# Patient Record
Sex: Male | Born: 1985 | Race: White | Hispanic: No | Marital: Married
Health system: Southern US, Community
[De-identification: ages and names within clinical notes are randomized; demographics above are authoritative.]

---

## 2019-09-18 ENCOUNTER — Emergency Department (HOSPITAL_COMMUNITY): Payer: Self-pay

## 2019-09-18 ENCOUNTER — Ambulatory Visit (HOSPITAL_COMMUNITY)
Admission: EM | Admit: 2019-09-18 | Discharge: 2019-09-19 | Disposition: A | Discharge status: Home / Self Care | Payer: Self-pay | Attending: Emergency Medicine | Admitting: Emergency Medicine

## 2019-09-18 ENCOUNTER — Encounter (HOSPITAL_COMMUNITY)
Admission: EM | Disposition: A | Discharge status: Home / Self Care | Payer: Self-pay | Source: Home / Self Care | Attending: Emergency Medicine

## 2019-09-18 ENCOUNTER — Other Ambulatory Visit: Payer: Self-pay

## 2019-09-18 ENCOUNTER — Encounter (HOSPITAL_COMMUNITY): Payer: Self-pay | Admitting: Emergency Medicine

## 2019-09-18 ENCOUNTER — Emergency Department (HOSPITAL_COMMUNITY): Payer: Self-pay | Admitting: Anesthesiology

## 2019-09-18 DIAGNOSIS — W228XXA Striking against or struck by other objects, initial encounter: Secondary | ICD-10-CM | POA: Insufficient documentation

## 2019-09-18 DIAGNOSIS — Z79899 Other long term (current) drug therapy: Secondary | ICD-10-CM | POA: Insufficient documentation

## 2019-09-18 DIAGNOSIS — N433 Hydrocele, unspecified: Secondary | ICD-10-CM | POA: Insufficient documentation

## 2019-09-18 DIAGNOSIS — S3994XA Unspecified injury of external genitals, initial encounter: Secondary | ICD-10-CM | POA: Insufficient documentation

## 2019-09-18 DIAGNOSIS — Z20822 Contact with and (suspected) exposure to covid-19: Secondary | ICD-10-CM | POA: Insufficient documentation

## 2019-09-18 DIAGNOSIS — T1490XA Injury, unspecified, initial encounter: Secondary | ICD-10-CM

## 2019-09-18 HISTORY — PX: SCROTAL EXPLORATION: SHX2386

## 2019-09-18 LAB — CBC
HCT: 42.7 % (ref 39.0–52.0)
Hemoglobin: 13.6 g/dL (ref 13.0–17.0)
MCH: 28.2 pg (ref 26.0–34.0)
MCHC: 31.9 g/dL (ref 30.0–36.0)
MCV: 88.4 fL (ref 80.0–100.0)
Platelets: 188 10*3/uL (ref 150–400)
RBC: 4.83 MIL/uL (ref 4.22–5.81)
RDW: 12.9 % (ref 11.5–15.5)
WBC: 7.3 10*3/uL (ref 4.0–10.5)
nRBC: 0 % (ref 0.0–0.2)

## 2019-09-18 LAB — BASIC METABOLIC PANEL
Anion gap: 12 (ref 5–15)
BUN: 21 mg/dL — ABNORMAL HIGH (ref 6–20)
CO2: 23 mmol/L (ref 22–32)
Calcium: 9.8 mg/dL (ref 8.9–10.3)
Chloride: 105 mmol/L (ref 98–111)
Creatinine, Ser: 1.02 mg/dL (ref 0.61–1.24)
GFR calc Af Amer: >60 mL/min (ref >60)
GFR calc non Af Amer: >60 mL/min (ref >60)
Glucose, Bld: 77 mg/dL (ref 70–99)
Potassium: 4.2 mmol/L (ref 3.5–5.1)
Sodium: 140 mmol/L (ref 135–145)

## 2019-09-18 LAB — URINALYSIS, ROUTINE W REFLEX MICROSCOPIC
Bilirubin Urine: NEGATIVE
Glucose, UA: NEGATIVE mg/dL
Hgb urine dipstick: NEGATIVE
Ketones, ur: NEGATIVE mg/dL
Leukocytes,Ua: NEGATIVE
Nitrite: NEGATIVE
Protein, ur: NEGATIVE mg/dL
Specific Gravity, Urine: 1.038 — ABNORMAL HIGH (ref 1.005–1.030)
pH: 9 — ABNORMAL HIGH (ref 5.0–8.0)

## 2019-09-18 LAB — SARS CORONAVIRUS 2 BY RT PCR (HOSPITAL ORDER, PERFORMED IN ~~LOC~~ HOSPITAL LAB): SARS Coronavirus 2: NEGATIVE

## 2019-09-18 SURGERY — EXPLORATION, SCROTUM
Anesthesia: General | Laterality: Right

## 2019-09-18 MED ORDER — FENTANYL CITRATE (PF) 250 MCG/5ML IJ SOLN
INTRAMUSCULAR | Status: AC
  Filled 2019-09-18: qty 5

## 2019-09-18 MED ORDER — MIDAZOLAM HCL 5 MG/5ML IJ SOLN
INTRAMUSCULAR | Status: DC | PRN
  Administered 2019-09-18: 2 mg via INTRAVENOUS

## 2019-09-18 MED ORDER — DEXAMETHASONE SODIUM PHOSPHATE 10 MG/ML IJ SOLN
INTRAMUSCULAR | Status: DC | PRN
  Administered 2019-09-18: 10 mg via INTRAVENOUS

## 2019-09-18 MED ORDER — PROPOFOL 10 MG/ML IV BOLUS
INTRAVENOUS | Status: DC | PRN
  Administered 2019-09-18: 170 mg via INTRAVENOUS

## 2019-09-18 MED ORDER — SUCCINYLCHOLINE CHLORIDE 200 MG/10ML IV SOSY
PREFILLED_SYRINGE | INTRAVENOUS | Status: DC | PRN
  Administered 2019-09-18: 130 mg via INTRAVENOUS

## 2019-09-18 MED ORDER — IOHEXOL 300 MG/ML  SOLN
100.0000 mL | Freq: Once | INTRAMUSCULAR | Status: AC | PRN
  Administered 2019-09-18: 100 mL via INTRAVENOUS

## 2019-09-18 MED ORDER — BUPIVACAINE HCL (PF) 0.5 % IJ SOLN
INTRAMUSCULAR | Status: AC
  Filled 2019-09-18: qty 30

## 2019-09-18 MED ORDER — DIPHENHYDRAMINE HCL 50 MG/ML IJ SOLN
INTRAMUSCULAR | Status: DC | PRN
  Administered 2019-09-18: 12.5 mg via INTRAVENOUS

## 2019-09-18 MED ORDER — OXYCODONE HCL 5 MG/5ML PO SOLN: 5.0000 mg | Freq: Once | ORAL | Status: DC | PRN

## 2019-09-18 MED ORDER — ONDANSETRON HCL 4 MG/2ML IJ SOLN
INTRAMUSCULAR | Status: DC | PRN
  Administered 2019-09-18: 4 mg via INTRAVENOUS

## 2019-09-18 MED ORDER — LIDOCAINE 2% (20 MG/ML) 5 ML SYRINGE
INTRAMUSCULAR | Status: DC | PRN
  Administered 2019-09-18: 100 mg via INTRAVENOUS

## 2019-09-18 MED ORDER — HYDROMORPHONE HCL 1 MG/ML IJ SOLN
1.0000 mg | INTRAMUSCULAR | Status: AC | PRN
  Administered 2019-09-18 (×2): 1 mg via INTRAVENOUS
  Filled 2019-09-18 (×2): qty 1

## 2019-09-18 MED ORDER — SODIUM CHLORIDE 0.9 % IV SOLN
1000.0000 mL | INTRAVENOUS | Status: DC
  Administered 2019-09-18: 1000 mL via INTRAVENOUS

## 2019-09-18 MED ORDER — CEFAZOLIN (ANCEF) 1 G IV SOLR: 2.0000 g | INTRAVENOUS | Status: DC

## 2019-09-18 MED ORDER — BACITRACIN-NEOMYCIN-POLYMYXIN OINTMENT TUBE
TOPICAL_OINTMENT | CUTANEOUS | Status: AC
  Filled 2019-09-18: qty 14.17

## 2019-09-18 MED ORDER — LACTATED RINGERS IV SOLN: INTRAVENOUS | Status: DC | PRN

## 2019-09-18 MED ORDER — OXYCODONE HCL 5 MG PO TABS: 5.0000 mg | ORAL_TABLET | Freq: Once | ORAL | Status: DC | PRN

## 2019-09-18 MED ORDER — MIDAZOLAM HCL 2 MG/2ML IJ SOLN
INTRAMUSCULAR | Status: AC
  Filled 2019-09-18: qty 2

## 2019-09-18 MED ORDER — FENTANYL CITRATE (PF) 100 MCG/2ML IJ SOLN: 25.0000 ug | INTRAMUSCULAR | Status: DC | PRN

## 2019-09-18 MED ORDER — ONDANSETRON HCL 4 MG/2ML IJ SOLN: 4.0000 mg | Freq: Once | INTRAMUSCULAR | Status: DC | PRN

## 2019-09-18 MED ORDER — PROPOFOL 10 MG/ML IV BOLUS
INTRAVENOUS | Status: AC
  Filled 2019-09-18: qty 20

## 2019-09-18 MED ORDER — OXYCODONE-ACETAMINOPHEN 5-325 MG PO TABS: 1.0000 | ORAL_TABLET | Freq: Four times a day (QID) | ORAL | 0 refills | Status: AC | PRN

## 2019-09-18 MED ORDER — FENTANYL CITRATE (PF) 250 MCG/5ML IJ SOLN
INTRAMUSCULAR | Status: DC | PRN
  Administered 2019-09-18: 50 ug via INTRAVENOUS
  Administered 2019-09-18: 100 ug via INTRAVENOUS

## 2019-09-18 MED ORDER — SODIUM CHLORIDE 0.9 % IV BOLUS (SEPSIS)
1000.0000 mL | Freq: Once | INTRAVENOUS | Status: AC
  Administered 2019-09-18: 1000 mL via INTRAVENOUS

## 2019-09-18 MED ORDER — CEFAZOLIN SODIUM-DEXTROSE 2-4 GM/100ML-% IV SOLN
2.0000 g | INTRAVENOUS | Status: AC
  Administered 2019-09-18: 2 g via INTRAVENOUS
  Filled 2019-09-18 (×2): qty 100

## 2019-09-18 MED ORDER — BUPIVACAINE HCL (PF) 0.25 % IJ SOLN
INTRAMUSCULAR | Status: DC | PRN
  Administered 2019-09-18: 9 mL

## 2019-09-18 SURGICAL SUPPLY — 34 items
BAG DECANTER FOR FLEXI CONT (MISCELLANEOUS) ×3 IMPLANT
BLADE SURG 15 STRL LF DISP TIS (BLADE) ×1 IMPLANT
BLADE SURG 15 STRL SS (BLADE) ×3
COVER SURGICAL LIGHT HANDLE (MISCELLANEOUS) ×6 IMPLANT
COVER WAND RF STERILE (DRAPES) ×3 IMPLANT
DRAPE HALF SHEET 40X57 (DRAPES) IMPLANT
DRAPE LAPAROTOMY T 102X78X121 (DRAPES) IMPLANT
ELECT REM PT RETURN 9FT ADLT (ELECTROSURGICAL) ×3
ELECTRODE REM PT RTRN 9FT ADLT (ELECTROSURGICAL) ×1 IMPLANT
GAUZE 4X4 16PLY RFD (DISPOSABLE) ×3 IMPLANT
GLOVE BIOGEL M STRL SZ7.5 (GLOVE) ×3 IMPLANT
GOWN STRL REUS W/ TWL LRG LVL3 (GOWN DISPOSABLE) ×2 IMPLANT
GOWN STRL REUS W/TWL LRG LVL3 (GOWN DISPOSABLE) ×6
HANDPIECE INTERPULSE COAX TIP (DISPOSABLE)
KIT BASIN OR (CUSTOM PROCEDURE TRAY) ×3 IMPLANT
KIT TURNOVER KIT B (KITS) ×3 IMPLANT
LEGGING LITHOTOMY PAIR STRL (DRAPES) IMPLANT
NEEDLE HYPO 25X1 1.5 SAFETY (NEEDLE) IMPLANT
NS IRRIG 1000ML POUR BTL (IV SOLUTION) ×3 IMPLANT
PACK SURGICAL SETUP 50X90 (CUSTOM PROCEDURE TRAY) ×3 IMPLANT
PAD ARMBOARD 7.5X6 YLW CONV (MISCELLANEOUS) ×6 IMPLANT
PENCIL BUTTON HOLSTER BLD 10FT (ELECTRODE) ×3 IMPLANT
SET HNDPC FAN SPRY TIP SCT (DISPOSABLE) IMPLANT
SPONGE LAP 4X18 RFD (DISPOSABLE) IMPLANT
SUPPORT SCROTAL MEDIUM (SOFTGOODS) ×3 IMPLANT
SUT CHROMIC 3 0 PS 2 (SUTURE) IMPLANT
SUT CHROMIC 3 0 SH 27 (SUTURE) IMPLANT
SUT CHROMIC 4 0 RB 1X27 (SUTURE) IMPLANT
SYR CONTROL 10ML LL (SYRINGE) ×3 IMPLANT
TOWEL GREEN STERILE FF (TOWEL DISPOSABLE) ×3 IMPLANT
TUBE CONNECTING 12'X1/4 (SUCTIONS) ×1
TUBE CONNECTING 12X1/4 (SUCTIONS) ×2 IMPLANT
WATER STERILE IRR 1000ML POUR (IV SOLUTION) ×3 IMPLANT
YANKAUER SUCT BULB TIP NO VENT (SUCTIONS) ×3 IMPLANT

## 2019-09-18 NOTE — ED Provider Notes (Signed)
[facility] EMERGENCY DEPARTMENT Provider Note   CSN: [id number] Arrival date & time: 09/18/19  1457     History Crush injury  Charles Erickson is a 33 y.o. male.  HPI   Patient presents ED for evaluation after being crushed by a tree. Patient was working at heights on a tree. The pulley system malfunctioned and the patient was pinned to the tree. Patient was hit in the thigh and pelvis region by an approximately 1000 pound limit. Patient denies any loss of consciousness. He is not having any chest pain or shortness of breath. He is having severe pain in his groin as well as his pelvis and thighs.  History reviewed. No pertinent past medical history.  There are no problems to display for this patient.   History reviewed. No pertinent surgical history.     History reviewed. No pertinent family history.  Social History   Tobacco Use  . Smoking status: Not on file  Substance Use Topics  . Alcohol use: Not on file  . Drug use: Not on file    Home Medications Prior to Admission medications   Medication Sig Start Date End Date Taking? Authorizing Provider  albuterol (VENTOLIN HFA) 108 (90 Base) MCG/ACT inhaler Inhale 2 puffs into the lungs every 6 (six) hours as needed for wheezing or shortness of breath.   Yes [provider]  METHADONE HCL PO Take 1 tablet by mouth daily. 200mg    Yes [provider]    Allergies    Patient has no known allergies.  Review of Systems   Review of Systems  All other systems reviewed and are negative.   Physical Exam Updated Vital Signs BP (!) 138/101   Pulse 78   Temp 99.2 F (37.3 C) (Oral)   Resp 17   Ht 1.829 m (6')   Wt 72.6 kg   SpO2 99%   BMI 21.70 kg/m   Physical Exam Vitals and nursing note reviewed.  Constitutional:      General: He is not in acute distress.    Appearance: Normal appearance. He is well-developed. He is not diaphoretic.  HENT:     Head: Normocephalic and  atraumatic. No raccoon eyes or Battle's sign.     Right Ear: External ear normal.     Left Ear: External ear normal.  Eyes:     General: Lids are normal.        Right eye: No discharge.     Conjunctiva/sclera:     Right eye: No hemorrhage.    Left eye: No hemorrhage. Neck:     Trachea: No tracheal deviation.  Cardiovascular:     Rate and Rhythm: Normal rate and regular rhythm.     Heart sounds: Normal heart sounds.  Pulmonary:     Effort: Pulmonary effort is normal. No respiratory distress.     Breath sounds: Normal breath sounds. No stridor.  Chest:     Chest wall: No deformity, tenderness or crepitus.  Abdominal:     General: Bowel sounds are normal. There is no distension.     Palpations: Abdomen is soft. There is no mass.     Tenderness: There is no abdominal tenderness.     Hernia: There is no hernia in the left inguinal area or right inguinal area.     Comments: No bruising abdomen  Genitourinary:    Penis: Tenderness present.      Testes:        Right: Tenderness and swelling present.  Left: Tenderness or swelling not present.  Musculoskeletal:     Cervical back: Normal. No swelling, edema, deformity or tenderness. No spinous process tenderness.     Right upper leg: Swelling, edema and tenderness present.     Left upper leg: Swelling, edema and tenderness present.     Right knee: Bony tenderness present. No deformity.     Left knee: Bony tenderness present. No deformity.     Right lower leg: Normal.     Left lower leg: Normal.     Right ankle: Normal.     Left ankle: Normal.     Comments: ttp pelvis, bruising noted  Skin:    Comments: Abrasions thighs  Neurological:     Mental Status: He is alert.     GCS: GCS eye subscore is 4. GCS verbal subscore is 5. GCS motor subscore is 6.     Sensory: No sensory deficit.     Motor: No abnormal muscle tone.     Comments: Able to move all extremities, sensation intact throughout  Psychiatric:        Speech: Speech  normal.        Behavior: Behavior normal.     ED Results / Procedures / Treatments   Labs (all labs ordered are listed, but only abnormal results are displayed) Labs Reviewed  URINALYSIS, ROUTINE W REFLEX MICROSCOPIC - Abnormal; Notable for the following components:      Result Value   APPearance CLOUDY (*)    Specific Gravity, Urine 1.038 (*)    pH 9.0 (*)    All other components within normal limits  BASIC METABOLIC PANEL - Abnormal; Notable for the following components:   BUN 21 (*)    All other components within normal limits  SARS CORONAVIRUS 2 BY RT PCR (HOSPITAL ORDER, PERFORMED IN [facility])  CBC  TYPE AND SCREEN  ABO/RH    EKG None  Radiology DG Chest 1 View  Result Date: 09/18/2019 CLINICAL DATA:  Struck by falling equipment/branch EXAM: CHEST  1 VIEW COMPARISON:  None. FINDINGS: No consolidation, features of edema, pneumothorax, or effusion. Pulmonary vascularity is normally distributed. The cardiomediastinal contours are unremarkable. No acute osseous or soft tissue abnormality. Age advanced degenerative changes of the right shoulder without clear traumatic injury. Telemetry leads overlie the chest. IMPRESSION: No acute cardiopulmonary or traumatic findings in the chest. Slightly age advanced degenerative change of the right shoulder. Correlate with history. Electronically Signed   By: Kreg Shropshire M.D.   On: 09/18/2019 17:21   US SCROTUM  Result Date: 09/18/2019 CLINICAL DATA:  Blunt trauma EXAM: ULTRASOUND OF SCROTUM TECHNIQUE: Complete ultrasound examination of the testicles, epididymis, and other scrotal structures was performed. COMPARISON:  None. FINDINGS: Right testicle Measurements: 5.5 x 3.5 x 3.6 cm. Mildly heterogeneous parenchymal echogenicity with some increased vascularity could reflect traumatic orchitis. There is a focal almost bandlike area of hypoechogenicity extending through the lower scrotal pole and traversing the capsule inferiorly  with some spared testicular parenchyma. Overall size of this abnormality is approximately 1.3 x 1.9 x 1.8 cm in size. A second site of hypoechoic heterogeneity is seen along the superior pole as well measuring 0.7 x 0.7 x 0.8 cm in size. Left testicle Measurements: 4.3 x 2.1 x 3.3 cm. Mild parenchymal heterogeneity with increased vascularity suggestive traumatic orchitis. No concerning testicular mass or lesion. Right epididymis: Anechoic 1.1 x 1.2 x 1.2 right epididymal head cyst. Background epididymal parenchyma is more normal in appearance. Left epididymis: 2.9  x 1.7 x 2.7 cm anechoic left epididymal head cyst as well. Visible portions of the left epididymis are more normal appearance. Hydrocele: Small bilateral hydroceles without internal complexity is suggest a large hematocele. Varicocele:  None visualized. IMPRESSION: Bandlike hypoechoic region extending through the lower pole right testis is concerning for testicular fracture without clear disruption of the tunic albuginea to suggest frank testicular rupture. An additional hypoechoic region in the upper pole may reflect a secondary site of contained intraparenchymal hematoma. More mild bilateral heterogeneity of both testes with increased vascularity could suggest some traumatic orchitis as well. Bilateral epididymal head cysts, left greater than right. Small anechoic bilateral hydroceles without discernible hemorrhagic products to suggest capsular rupture. These results were called by telephone at the time of interpretation on 09/18/2019 at 5:19 pm to provider [facility] , who verbally acknowledged these results. Electronically Signed   By: Kreg Shropshire M.D.   On: 09/18/2019 17:17   CT ABDOMEN PELVIS W CONTRAST  Result Date: 09/18/2019 CLINICAL DATA:  Pinned by tree limb and tree, right abdominal and flank pain EXAM: CT ABDOMEN AND PELVIS WITH CONTRAST TECHNIQUE: Multidetector CT imaging of the abdomen and pelvis was performed using the standard protocol  following bolus administration of intravenous contrast. CONTRAST:  OMNIPAQUE IOHEXOL 300 MG/ML  SOLN COMPARISON:  Chest radiograph 09/18/2019 FINDINGS: Lower chest: Lung bases are clear. Normal heart size. No pericardial effusion. Hepatobiliary: No hepatic injury or perihepatic hematoma. No worrisome focal liver lesions. Smooth liver surface contour. Normal hepatic attenuation. Normal gallbladder and biliary tree without visible calcified gallstone. Pancreas: Slight diminutive appearance of the pancreas without contusive change, ductal disruption, focal peripancreatic inflammation or ductal dilatation. Spleen: No splenic injury or perisplenic hematoma. Normal splenic size. No concerning lesions. Adrenals/Urinary Tract: No adrenal hemorrhage or suspicious adrenal lesions. No direct renal injury or perinephric hemorrhage. Kidneys enhance and excrete symmetrically without extravasation of contrast on excretory delayed phase imaging. Few subcentimeter hypertension foci in both kidneys too small to fully characterize on CT imaging but statistically likely benign. No concerning renal mass. No urolithiasis or hydronephrosis. No evidence of traumatic bladder injury. Stomach/Bowel: Distal esophagus, stomach and duodenal sweep are unremarkable. No small bowel wall thickening or dilatation. No evidence of obstruction. A normal appendix is visualized. No colonic dilatation or wall thickening. No evidence of mesenteric hematoma or contusion. Vascular/Lymphatic: No acute vascular luminal abnormality or direct vascular injury of the abdomen or pelvis. No aneurysm or ectasia. No suspicious or enlarged lymph nodes in the included lymphatic chains. Reproductive: The prostate and seminal vesicles are unremarkable. Other: No abdominal wall hematoma or contusion. No retroperitoneal hemorrhage. No abdominopelvic free air or fluid. No bowel containing hernias. Musculoskeletal: No acute injury of the included ribs, thoracolumbar  spine, bony pelvis or proximal femora. Musculature appears normal and symmetric without traumatic abdominal wall dehiscence. IMPRESSION: No evidence of acute traumatic injury within the abdomen or pelvis. Electronically Signed   By: Kreg Shropshire M.D.   On: 09/18/2019 17:32   DG Knee Complete 4 Views Left  Result Date: 09/18/2019 CLINICAL DATA:  Struck by falling equipment/tree branch EXAM: LEFT KNEE - COMPLETE 4+ VIEW COMPARISON:  Concurrent femoral radiographs FINDINGS: No significant soft tissue swelling or joint effusion. No soft tissue gas or foreign body. No acute bony abnormality. Specifically, no fracture, subluxation, or dislocation. No significant arthrosis or underlying arthropathy. Normal bone mineralization. No worrisome osseous lesions. IMPRESSION: No acute osseous or soft tissue abnormality. Electronically Signed   By: Coralie Keens.D.  On: 09/18/2019 17:22   DG Knee Complete 4 Views Right  Result Date: 09/18/2019 CLINICAL DATA:  Struck by falling equipment/tree branch. EXAM: RIGHT KNEE - COMPLETE 4+ VIEW COMPARISON:  Concurrent femoral radiographs FINDINGS: No significant swelling or effusion. No soft tissue gas or foreign body. No acute bony abnormality. Specifically, no fracture, subluxation, or dislocation. Mild enthesopathic changes along the anterosuperior pole patella at the distal quadriceps insertion. Corticated fabella posteriorly. No acute or worrisome osseous lesion. IMPRESSION: 1. No acute osseous or soft tissue abnormality. 2. Mild enthesopathic changes along the anterosuperior pole patellar insertion of the distal quadriceps tendon. Electronically Signed   By: Kreg Shropshire M.D.   On: 09/18/2019 17:25   DG FEMUR MIN 2 VIEWS LEFT  Result Date: 09/18/2019 CLINICAL DATA:  Struck by falling equipment/tree branch. EXAM: LEFT FEMUR 2 VIEWS COMPARISON:  Concurrent knee radiographs. FINDINGS: Included bones of the left hemipelvis are intact and unremarkable. Left femur is intact.  Femoral head is normally located. No significant soft tissue swelling, gas or foreign body. Additional findings in the knee better detailed on dedicated knee radiographs. IMPRESSION: No acute osseous or soft tissue abnormality. Electronically Signed   By: Kreg Shropshire M.D.   On: 09/18/2019 17:27   DG FEMUR, MIN 2 VIEWS RIGHT  Result Date: 09/18/2019 CLINICAL DATA:  Struck by falling equipment/tree branch. EXAM: RIGHT FEMUR 2 VIEWS COMPARISON:  Concurrent knee radiographs. FINDINGS: Included bones of the right pelvis are intact and congruent. Femur intact. Femoral head is normally located. Mild enthesopathic changes at the superior pole patellar insertion of the distal quadriceps tendon. No other significant degenerative changes. Soft tissues are unremarkable. No soft tissue gas or foreign body. IMPRESSION: 1. No acute osseous abnormality. 2. Mild enthesopathic changes at the superior pole patellar insertion of the distal quadriceps tendon. Electronically Signed   By: Kreg Shropshire M.D.   On: 09/18/2019 17:24    Procedures Procedures (including critical care time)  Medications Ordered in ED Medications  sodium chloride 0.9 % bolus 1,000 mL (1,000 mLs Intravenous New Bag/Given 09/18/19 1754)    Followed by  0.9 %  sodium chloride infusion (has no administration in time range)  HYDROmorphone (DILAUDID) injection 1 mg (1 mg Intravenous Given 09/18/19 1804)  iohexol (OMNIPAQUE) 300 MG/ML solution 100 mL (100 mLs Intravenous Contrast Given 09/18/19 1707)    ED Course  I have reviewed the triage vital signs and the nursing notes.  Pertinent labs & imaging results that were available during my care of the patient were reviewed by me and considered in my medical decision making (see chart for details).  Clinical Course as of Sep 18 1835  Thu Sep 18, 2019  1711 Increased vascularity both testes, right testes hypoechogenicity , probable fracture of the testicle.   [JK]    Clinical Course User  Index [JK] Linwood Dibbles, MD   MDM Rules/Calculators/A&P                          Patient presented to ED for evaluation after a crush injury.  Main focus of injury was in the patient's pelvic region.  On exam he had bruising and contusion of his thighs.  Patient also had significant tenderness of his right testicle.  Patient remained hemodynamically stable.  CT scan of the abdomen pelvis does not show any acute injuries.  Plain films of his bilateral femurs and knees do not show any fracture.  Patient's ultrasound is concerning for the possibility  of testicular fracture.  I spoke with Dr. Benancio DeedsNewsome, no urology.  He will be evaluating the patient in the emergency room and anticipates operative exploration. Final Clinical Impression(s) / ED Diagnoses Final diagnoses:  Testicular injury, initial encounter      Linwood DibblesKnapp, Demont Linford, MD 09/18/19 [id number]

## 2019-09-18 NOTE — Transfer of Care (Signed)
Immediate Anesthesia Transfer of Care Note  Patient: Charles Erickson  Procedure(s) Performed: SCROTUM EXPLORATION WITH RIGHT SIMPLE ORCHIECTOMY (Right )  Patient Location: PACU  Anesthesia Type:General  Level of Consciousness: drowsy  Airway & Oxygen Therapy: Patient Spontanous Breathing  Post-op Assessment: Report given to RN and Post -op Vital signs reviewed and stable  Post vital signs: Reviewed and stable  Last Vitals:  Vitals Value Taken Time  BP 122/85 09/18/19 2229  Temp    Pulse 66 09/18/19 2230  Resp 16 09/18/19 2230  SpO2 97 % 09/18/19 2230  Vitals shown include unvalidated device data.  Last Pain:  Vitals:   09/18/19 1944  TempSrc: Oral  PainSc:          Complications: No complications documented.

## 2019-09-18 NOTE — ED Notes (Signed)
Stuck patient twice in both AC's, could not get type and screen blood draw at this time. Phlebotomy and RN notified.

## 2019-09-18 NOTE — ED Triage Notes (Signed)
BIB [facility] pt was hanging on the tree w/ pully system that malfunctioned causing the pt to be pinned to the tree. EMS estimated a 1000 lb limb hit pt. Pt COA x4 denies LOC. V/s 156/100, pulse 9, spo2 98% per ems. Swelling noted to abd and injury to R knee and lower hip

## 2019-09-18 NOTE — ED Notes (Signed)
Trauma RN requested assistance from phlebotomy as we were unable to obtain blood.

## 2019-09-18 NOTE — Anesthesia Preprocedure Evaluation (Signed)
Anesthesia Evaluation  Patient identified by MRN, date of birth, ID band Patient awake    Reviewed: Allergy & Precautions, NPO status , Patient's Chart, lab work & pertinent test results  Airway Mallampati: II  TM Distance: >3 FB Neck ROM: Full    Dental  (+) Teeth Intact, Dental Advisory Given   Pulmonary    breath sounds clear to auscultation       Cardiovascular  Rhythm:Regular Rate:Normal     Neuro/Psych    GI/Hepatic   Endo/Other    Renal/GU      Musculoskeletal   Abdominal   Peds  Hematology   Anesthesia Other Findings   Reproductive/Obstetrics                             Anesthesia Physical Anesthesia Plan  ASA: II and emergent  Anesthesia Plan: General   Post-op Pain Management:    Induction: Intravenous, Cricoid pressure planned and Rapid sequence  PONV Risk Score and Plan: Ondansetron and Dexamethasone  Airway Management Planned: Oral ETT  Additional Equipment:   Intra-op Plan:   Post-operative Plan: Extubation in OR  Informed Consent: I have reviewed the patients History and Physical, chart, labs and discussed the procedure including the risks, benefits and alternatives for the proposed anesthesia with the patient or authorized representative who has indicated his/her understanding and acceptance.     Dental advisory given  Plan Discussed with: CRNA and Anesthesiologist  Anesthesia Plan Comments:         Anesthesia Quick Evaluation  

## 2019-09-18 NOTE — H&P (Signed)
Urology Admission H&P  Chief Complaint: Possible right testicular rupture  History of Present Illness: Patient is a 34 year old white male who was working on cutting trees earlier today.  A rope around one of the tree branches apparently came loose and part of the apparatus hit him in the right scrotal area.  He is seen now in the ER for evaluation of right testicular pain and swelling.  Scrotal ultrasound suggest surface defect consistent with possible tunical disruption.  See report below.  CT of the abdomen pelvis otherwise negative for significant trauma.  History reviewed. No pertinent past medical history. History reviewed. No pertinent surgical history.  Home Medications:  Current Facility-Administered Medications  Medication Dose Route Frequency Provider Last Rate Last Admin  . 0.9 %  sodium chloride infusion  1,000 mL Intravenous Continuous Linwood Dibbles, MD       Current Outpatient Medications  Medication Sig Dispense Refill Last Dose  . albuterol (VENTOLIN HFA) 108 (90 Base) MCG/ACT inhaler Inhale 2 puffs into the lungs every 6 (six) hours as needed for wheezing or shortness of breath.   Past Week at Unknown time  . METHADONE HCL PO Take 1 tablet by mouth daily. 200mg    09/18/2019 at Unknown time   Allergies: No Known Allergies  History reviewed. No pertinent family history. Social History:  has no history on file for tobacco use, alcohol use, and drug use.  Review of Systems  Physical Exam:  Vital signs in last 24 hours: Temp:  [99.2 F (37.3 C)] 99.2 F (37.3 C) (07/22 1500) Pulse Rate:  [78-91] 78 (07/22 1803) Resp:  [12-17] 17 (07/22 1803) BP: (135-146)/(89-101) 138/101 (07/22 1802) SpO2:  [97 %-99 %] 99 % (07/22 1803) Weight:  [72.6 kg] 72.6 kg (07/22 1501) Physical Exam  General: Thin healthy-appearing white male no acute distress Heart: Regular rate and rhythm Lungs: Clear to auscultation Abdomen: Soft nontender GU: Testes descended bilaterally left side is  palpably normal.  Right side is moderately swollen and markedly tender to palpate with some irregularity along the surface of the testicle. X-ray studies reviewed: CLINICAL DATA:  Blunt trauma  EXAM: ULTRASOUND OF SCROTUM  TECHNIQUE: Complete ultrasound examination of the testicles, epididymis, and other scrotal structures was performed.  COMPARISON:  None.  FINDINGS: Right testicle  Measurements: 5.5 x 3.5 x 3.6 cm. Mildly heterogeneous parenchymal echogenicity with some increased vascularity could reflect traumatic orchitis. There is a focal almost bandlike area of hypoechogenicity extending through the lower scrotal pole and traversing the capsule inferiorly with some spared testicular parenchyma. Overall size of this abnormality is approximately 1.3 x 1.9 x 1.8 cm in size. A second site of hypoechoic heterogeneity is seen along the superior pole as well measuring 0.7 x 0.7 x 0.8 cm in size.  Left testicle  Measurements: 4.3 x 2.1 x 3.3 cm. Mild parenchymal heterogeneity with increased vascularity suggestive traumatic orchitis. No concerning testicular mass or lesion.  Right epididymis: Anechoic 1.1 x 1.2 x 1.2 right epididymal head cyst. Background epididymal parenchyma is more normal in appearance.  Left epididymis: 2.9 x 1.7 x 2.7 cm anechoic left epididymal head cyst as well. Visible portions of the left epididymis are more normal appearance.  Hydrocele: Small bilateral hydroceles without internal complexity is suggest a large hematocele.  Varicocele:  None visualized.  IMPRESSION: Bandlike hypoechoic region extending through the lower pole right testis is concerning for testicular fracture without clear disruption of the tunic albuginea to suggest frank testicular rupture. An additional hypoechoic region in the upper  pole may reflect a secondary site of contained intraparenchymal hematoma.  More mild bilateral heterogeneity of both testes with  increased vascularity could suggest some traumatic orchitis as well.  Bilateral epididymal head cysts, left greater than right.  Small anechoic bilateral hydroceles without discernible hemorrhagic products to suggest capsular rupture.  These results were called by telephone at the time of interpretation on 09/18/2019 at 5:19 pm to provider [facility] , who verbally acknowledged these results.  CLINICAL DATA:  Pinned by tree limb and tree, right abdominal and flank pain  EXAM: CT ABDOMEN AND PELVIS WITH CONTRAST  TECHNIQUE: Multidetector CT imaging of the abdomen and pelvis was performed using the standard protocol following bolus administration of intravenous contrast.  CONTRAST:  OMNIPAQUE IOHEXOL 300 MG/ML  SOLN  COMPARISON:  Chest radiograph 09/18/2019  FINDINGS: Lower chest: Lung bases are clear. Normal heart size. No pericardial effusion.  Hepatobiliary: No hepatic injury or perihepatic hematoma. No worrisome focal liver lesions. Smooth liver surface contour. Normal hepatic attenuation. Normal gallbladder and biliary tree without visible calcified gallstone.  Pancreas: Slight diminutive appearance of the pancreas without contusive change, ductal disruption, focal peripancreatic inflammation or ductal dilatation.  Spleen: No splenic injury or perisplenic hematoma. Normal splenic size. No concerning lesions.  Adrenals/Urinary Tract: No adrenal hemorrhage or suspicious adrenal lesions. No direct renal injury or perinephric hemorrhage. Kidneys enhance and excrete symmetrically without extravasation of contrast on excretory delayed phase imaging. Few subcentimeter hypertension foci in both kidneys too small to fully characterize on CT imaging but statistically likely benign. No concerning renal mass. No urolithiasis or hydronephrosis. No evidence of traumatic bladder injury.  Stomach/Bowel: Distal esophagus, stomach and duodenal sweep  are unremarkable. No small bowel wall thickening or dilatation. No evidence of obstruction. A normal appendix is visualized. No colonic dilatation or wall thickening. No evidence of mesenteric hematoma or contusion.  Vascular/Lymphatic: No acute vascular luminal abnormality or direct vascular injury of the abdomen or pelvis. No aneurysm or ectasia. No suspicious or enlarged lymph nodes in the included lymphatic chains.  Reproductive: The prostate and seminal vesicles are unremarkable.  Other: No abdominal wall hematoma or contusion. No retroperitoneal hemorrhage. No abdominopelvic free air or fluid. No bowel containing hernias.  Musculoskeletal: No acute injury of the included ribs, thoracolumbar spine, bony pelvis or proximal femora. Musculature appears normal and symmetric without traumatic abdominal wall dehiscence.  IMPRESSION: No evidence of acute traumatic injury within the abdomen or pelvis.   Electronically Signed   By: Kreg Shropshire M.D.   On: 09/18/2019 17:32  Electronically Signed   By: Kreg Shropshire M.D.   On: 09/18/2019 17:17 Laboratory Data:  Results for orders placed or performed during the hospital encounter of 09/18/19 (from the past 24 hour(s))  CBC     Status: None   Collection Time: 09/18/19  3:10 PM  Result Value Ref Range   WBC 7.3 4.0 - 10.5 K/uL   RBC 4.83 4.22 - 5.81 MIL/uL   Hemoglobin 13.6 13.0 - 17.0 g/dL   HCT 66.0 39 - 52 %   MCV 88.4 80.0 - 100.0 fL   MCH 28.2 26.0 - 34.0 pg   MCHC 31.9 30.0 - 36.0 g/dL   RDW 63.0 16.0 - 10.9 %   Platelets 188 150 - 400 K/uL   nRBC 0.0 0.0 - 0.2 %  Basic metabolic panel     Status: Abnormal   Collection Time: 09/18/19  3:10 PM  Result Value Ref Range   Sodium 140 135 -  145 mmol/L   Potassium 4.2 3.5 - 5.1 mmol/L   Chloride 105 98 - 111 mmol/L   CO2 23 22 - 32 mmol/L   Glucose, Bld 77 70 - 99 mg/dL   BUN 21 (H) 6 - 20 mg/dL   Creatinine, Ser 3.90 0.61 - 1.24 mg/dL   Calcium 9.8 8.9 - 30.0  mg/dL   GFR calc non Af Amer >60 >60 mL/min   GFR calc Af Amer >60 >60 mL/min   Anion gap 12 5 - 15  ABO/Rh     Status: None (Preliminary result)   Collection Time: 09/18/19  3:10 PM  Result Value Ref Range   ABO/RH(D)      O POS Performed at [facility], [address], [place], [place] [address]   Urinalysis, Routine w reflex microscopic     Status: Abnormal   Collection Time: 09/18/19  5:25 PM  Result Value Ref Range   Color, Urine YELLOW YELLOW   APPearance CLOUDY (A) CLEAR   Specific Gravity, Urine 1.038 (H) 1.005 - 1.030   pH 9.0 (H) 5.0 - 8.0   Glucose, UA NEGATIVE NEGATIVE mg/dL   Hgb urine dipstick NEGATIVE NEGATIVE   Bilirubin Urine NEGATIVE NEGATIVE   Ketones, ur NEGATIVE NEGATIVE mg/dL   Protein, ur NEGATIVE NEGATIVE mg/dL   Nitrite NEGATIVE NEGATIVE   Leukocytes,Ua NEGATIVE NEGATIVE  Type and screen [facility]     Status: None (Preliminary result)   Collection Time: 09/18/19  5:27 PM  Result Value Ref Range   ABO/RH(D) PENDING    Antibody Screen PENDING    Sample Expiration      09/21/2019,2359 Performed at [facility], [address], [place], [place] [address]    No results found for this or any previous visit (from the past 240 hour(s)). Creatinine: Recent Labs    09/18/19 1510  CREATININE 1.02   Baseline Creatinine:   Impression/Assessment:  Probable testicular rupture secondary to blunt scrotal trauma  Plan:  Scrotal exploration with testicular repair versus possible orchiectomy.  Risks and benefits procedure discussed with patient today.  Belva Agee 09/18/2019, 6:46 PM

## 2019-09-18 NOTE — ED Notes (Signed)
Pt declined a blanket or sheet each time I asked.

## 2019-09-18 NOTE — ED Notes (Signed)
RN attempted to call RN in OR to give Report, MD Joseline Anesthesiologist answered and confirmed he would take report. Report given and this RN again confirmed OR RN unavailable.   Later RN Desma Paganini called  This RN, inquiring about pt location ,  RN Desma Paganini denies report at this time , stating that report was given to MD Joseline

## 2019-09-18 NOTE — Anesthesia Procedure Notes (Signed)
Procedure Name: Intubation Date/Time: 09/18/2019 9:31 PM Performed by: Shirlyn Goltz, CRNA Pre-anesthesia Checklist: Patient identified, Emergency Drugs available, Suction available and Patient being monitored Patient Re-evaluated:Patient Re-evaluated prior to induction Oxygen Delivery Method: Circle system utilized Preoxygenation: Pre-oxygenation with 100% oxygen Induction Type: IV induction, Rapid sequence and Cricoid Pressure applied Laryngoscope Size: Mac and 4 Grade View: Grade I Tube type: Oral Tube size: 7.5 mm Number of attempts: 1 Airway Equipment and Method: Stylet Placement Confirmation: ETT inserted through vocal cords under direct vision,  positive ETCO2 and breath sounds checked- equal and bilateral Secured at: 21 cm Tube secured with: Tape Dental Injury: Teeth and Oropharynx as per pre-operative assessment

## 2019-09-18 NOTE — Op Note (Signed)
Operative note Preop diagnosis: Right testicular trauma possible nonviable testicle Postop diagnosis: Right testicular trauma, nonviable right testicle Procedure: Scrotal exploration, right simple orchiectomy Surgeon: Benancio Deeds Anesthesia: General Estimated blood loss minimal Operative findings large intratesticular hematoma, testicle appeared nonviable, simple orchiectomy performed Operative report: After obtaining informed consent for the patient is taken the major OR suite placed under general anesthesia.  Placed in the supine position and genitalia prepped and draped usual sterile fashion.  Proper pause and timeout was performed.  Transverse incision was made over the right hemiscrotum.  Dartos muscle was divided down to the tunica vaginalis.  This was then opened and the testicle was expressed out through the scrotal incision.  Testicle had a large intratesticular hematoma and had total purple color with very abnormal pallor.  I made an incision in the tunica albuginea and there was dark blood noted did not see any significant arterial bleeding.  Continue to visualize and observe testicle and again color appeared markedly purple suggesting nonviable testicle.  Was felt the testicle be removed.  The spermatic cord was dissected free.  Kelly clamp was placed across the spermatic cord and the testicle and distal spermatic cord removed.  The cut end of the spermatic cord was then oversewn with 0 PDS suture with a stick tie ligation.  The stump of the spermatic cord was allowed to retract back into the upper scrotum.  Wound was irrigated with normal saline.  Good hemostasis was noted.  Dartos muscle was closed with running 3-0 chromic suture.  The skin edges reapproximated with interrupted 3-0 chromic suture.  Half percent Marcaine was used for local anesthesia at termination of the case.  Nonstick dressing and then fluff dressing was placed for scrotal support.  Procedure was terminated he was awakened from  anesthesia and taken back to the recovery room in stable condition.  No immediate complication from the procedure.  Sponge and needle counts correct at termination of the case.

## 2019-09-18 NOTE — Anesthesia Postprocedure Evaluation (Signed)
Anesthesia Post Note  Patient: Charles Erickson  Procedure(s) Performed: SCROTUM EXPLORATION WITH RIGHT SIMPLE ORCHIECTOMY (Right )     Patient location during evaluation: PACU Anesthesia Type: General Level of consciousness: awake and alert Pain management: pain level controlled Vital Signs Assessment: post-procedure vital signs reviewed and stable Respiratory status: spontaneous breathing, nonlabored ventilation, respiratory function stable and patient connected to nasal cannula oxygen Cardiovascular status: blood pressure returned to baseline and stable Postop Assessment: no apparent nausea or vomiting Anesthetic complications: no   No complications documented.  Last Vitals:  Vitals:   09/18/19 2315 09/18/19 2330  BP: (!) 137/98 (!) 129/94  Pulse: 69 81  Resp: 13 20  Temp:  36.5 C  SpO2: 100% 100%    Last Pain:  Vitals:   09/18/19 2330  TempSrc:   PainSc: 0-No pain                 Amir Glaus COKER

## 2019-09-19 ENCOUNTER — Encounter (HOSPITAL_COMMUNITY): Payer: Self-pay | Admitting: Urology

## 2019-09-19 LAB — ABO/RH: ABO/RH(D): O POS

## 2019-09-21 LAB — BPAM RBC
Blood Product Expiration Date: 202108242359
Blood Product Expiration Date: 202108242359
Unit Type and Rh: 5100
Unit Type and Rh: 5100

## 2019-09-21 LAB — TYPE AND SCREEN
ABO/RH(D): O POS
Antibody Screen: POSITIVE
Donor AG Type: NEGATIVE
Donor AG Type: NEGATIVE
PT AG Type: NEGATIVE
Unit division: 0
Unit division: 0

## 2019-09-22 LAB — SURGICAL PATHOLOGY

## 2019-10-02 ENCOUNTER — Other Ambulatory Visit: Payer: Self-pay

## 2019-10-02 ENCOUNTER — Encounter (HOSPITAL_COMMUNITY): Payer: Self-pay

## 2019-10-02 ENCOUNTER — Emergency Department (HOSPITAL_COMMUNITY)
Admission: EM | Admit: 2019-10-02 | Discharge: 2019-10-03 | Disposition: A | Discharge status: Home / Self Care | Payer: Self-pay | Attending: Emergency Medicine | Admitting: Emergency Medicine

## 2019-10-02 DIAGNOSIS — Z5321 Procedure and treatment not carried out due to patient leaving prior to being seen by health care provider: Secondary | ICD-10-CM | POA: Insufficient documentation

## 2019-10-02 DIAGNOSIS — R1032 Left lower quadrant pain: Secondary | ICD-10-CM | POA: Insufficient documentation

## 2019-10-02 NOTE — ED Notes (Signed)
Pt called to be triaged no response x2

## 2019-10-02 NOTE — ED Triage Notes (Signed)
Pt arrives POV for eval of pain to L groin. Pt was involved in an accident 7/22 and had R orchiectomy performed d/t accident. Pt reports testicles are healing well, but now w/ large hard bump to L upper thigh/groin. Pt reports appeared over last 3-4 days and making it hard to walk. Bump does not appear infected, erythematous.

## 2019-10-02 NOTE — ED Notes (Signed)
Patient states he went outside to get food from his mother and talk to her but states he never "left." registration moved him from otf to patient waiting so he could be triaged.

## 2019-10-02 NOTE — ED Notes (Signed)
Pt called to be triaged no response x1

## 2019-10-03 NOTE — ED Notes (Signed)
Pt did not answer x 3 

## 2021-01-18 IMAGING — DX DG KNEE COMPLETE 4+V*R*
4 series · 4 of 4 positions shown · non-contrast
Comparison: Concurrent femoral radiographs

CLINICAL DATA: Struck by falling equipment/tree branch.

EXAM:
RIGHT KNEE - COMPLETE 4+ VIEW

[knee obl (1 of 2)]
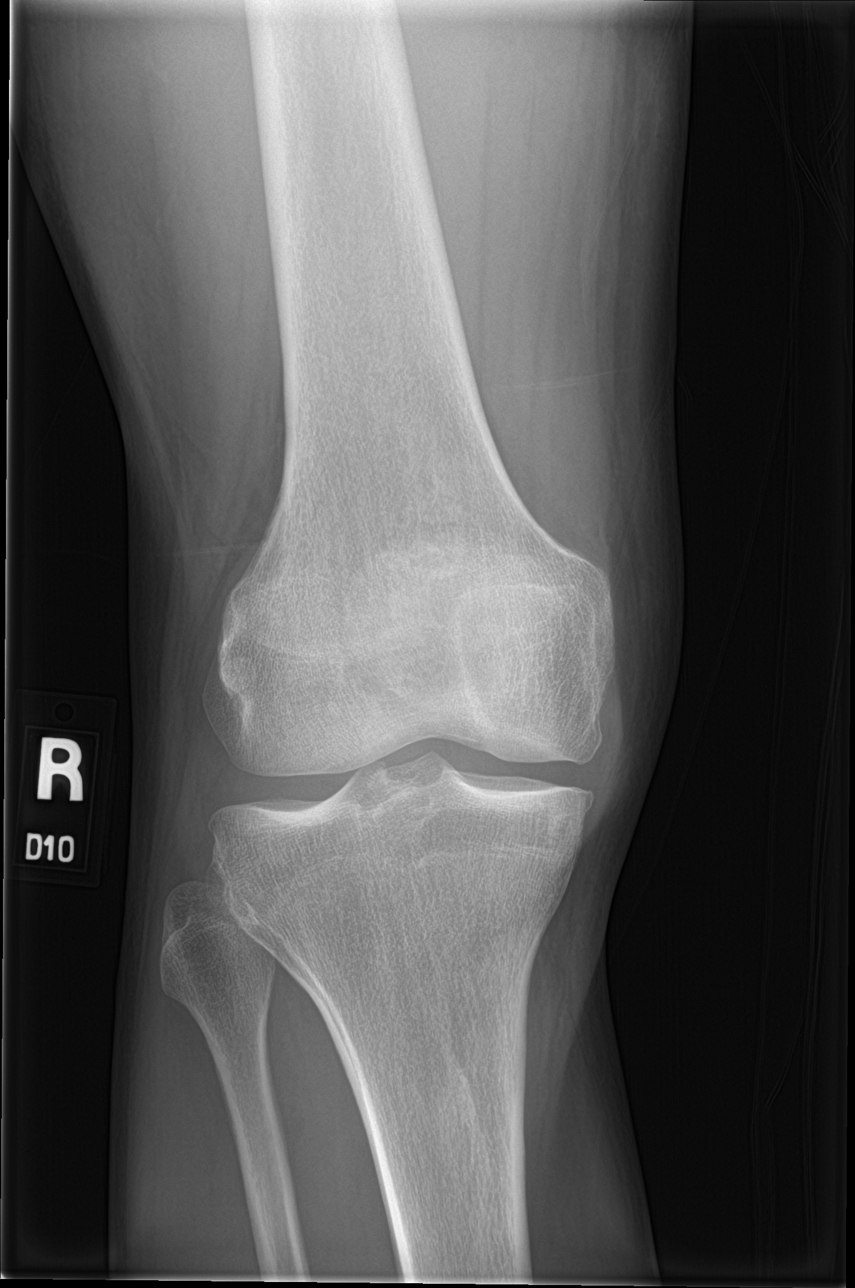

[knee lat]
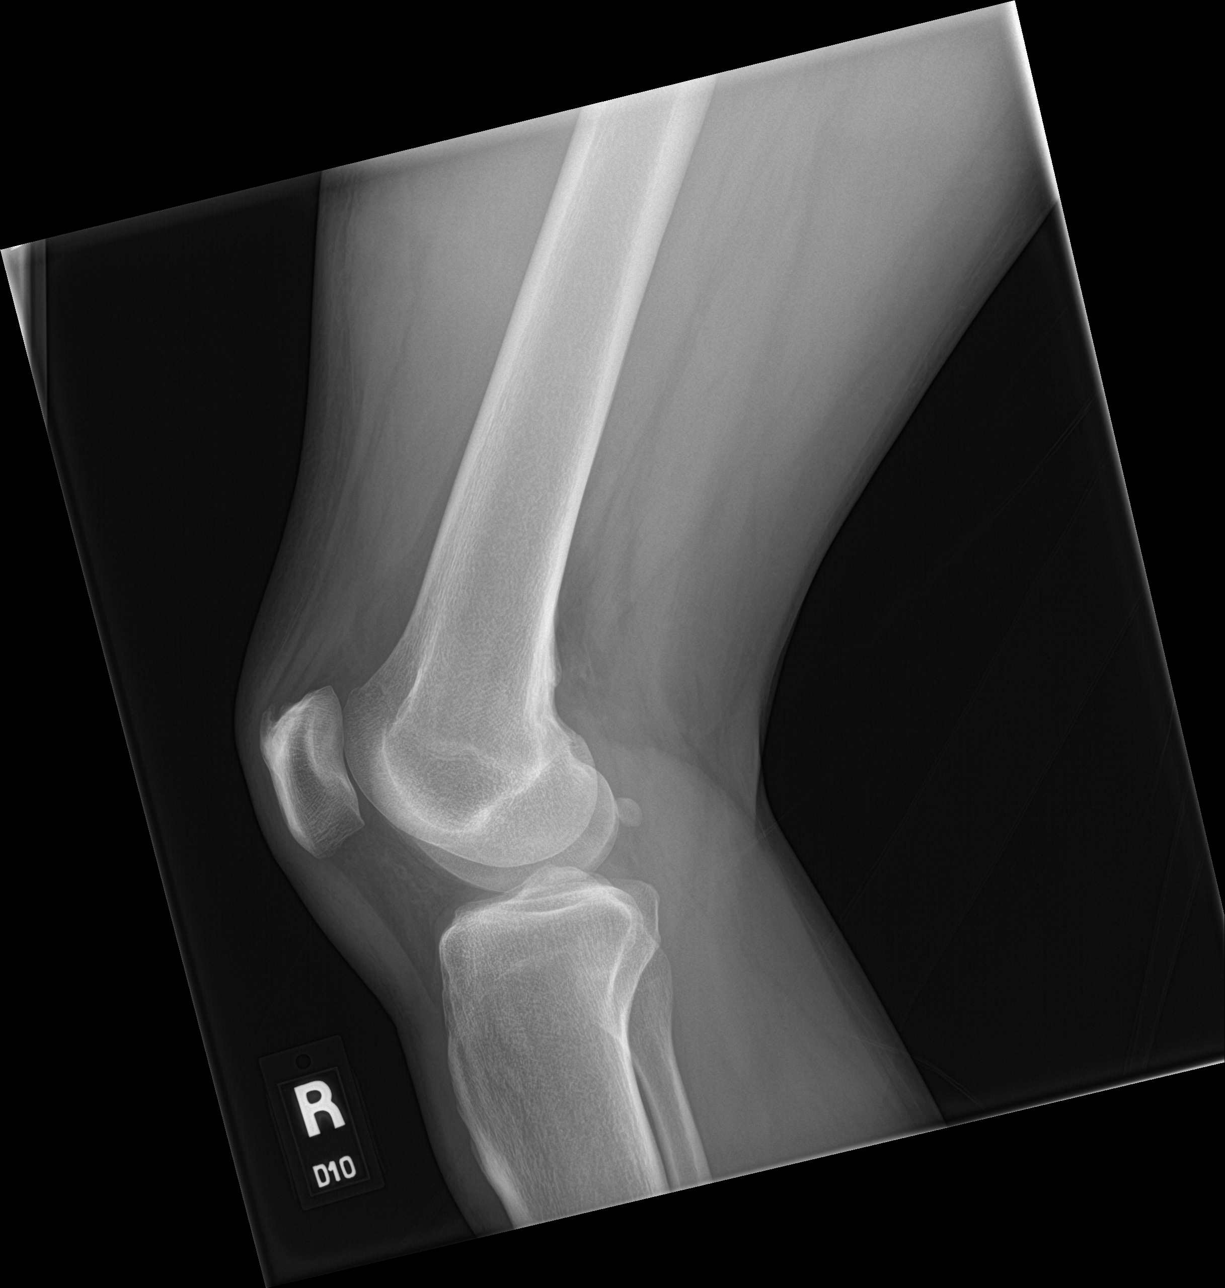

[knee ap]
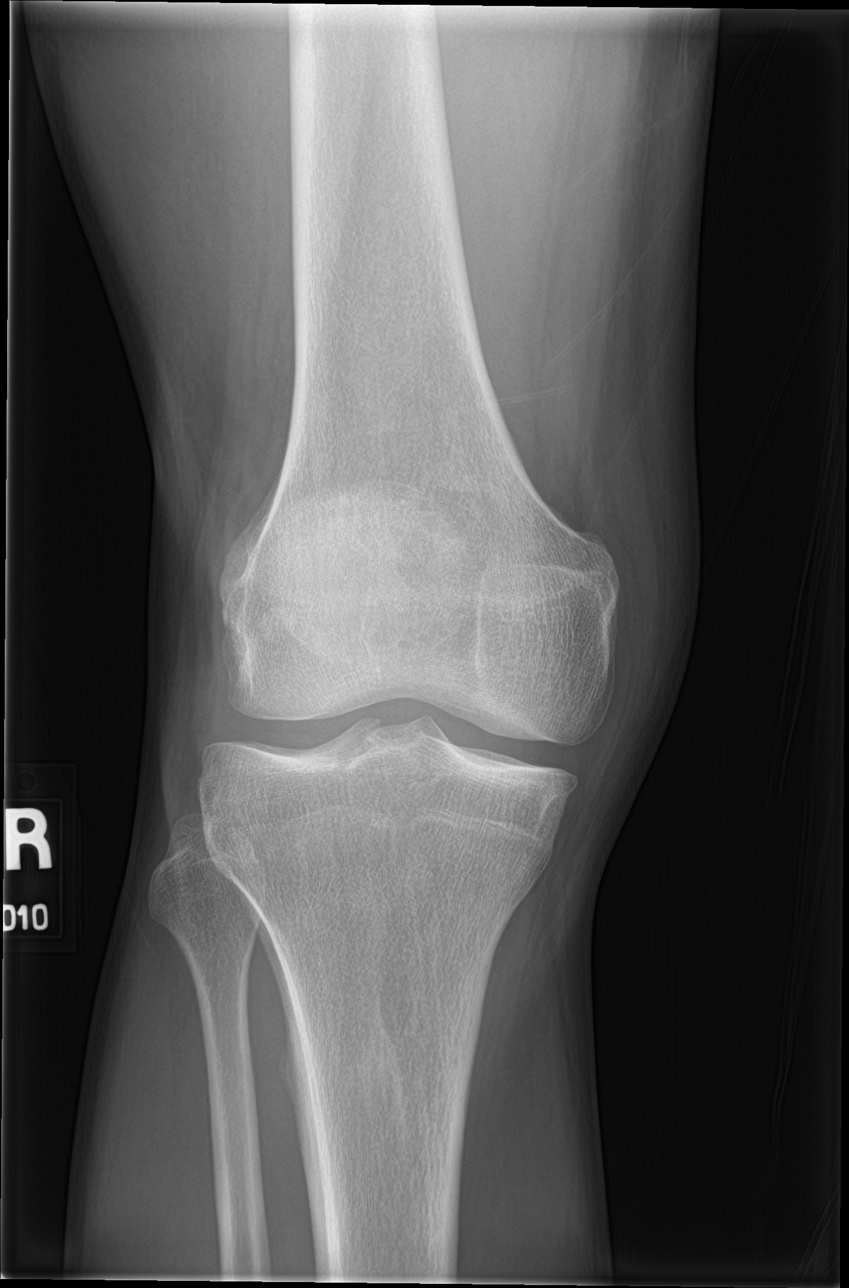

[knee obl (2 of 2)]
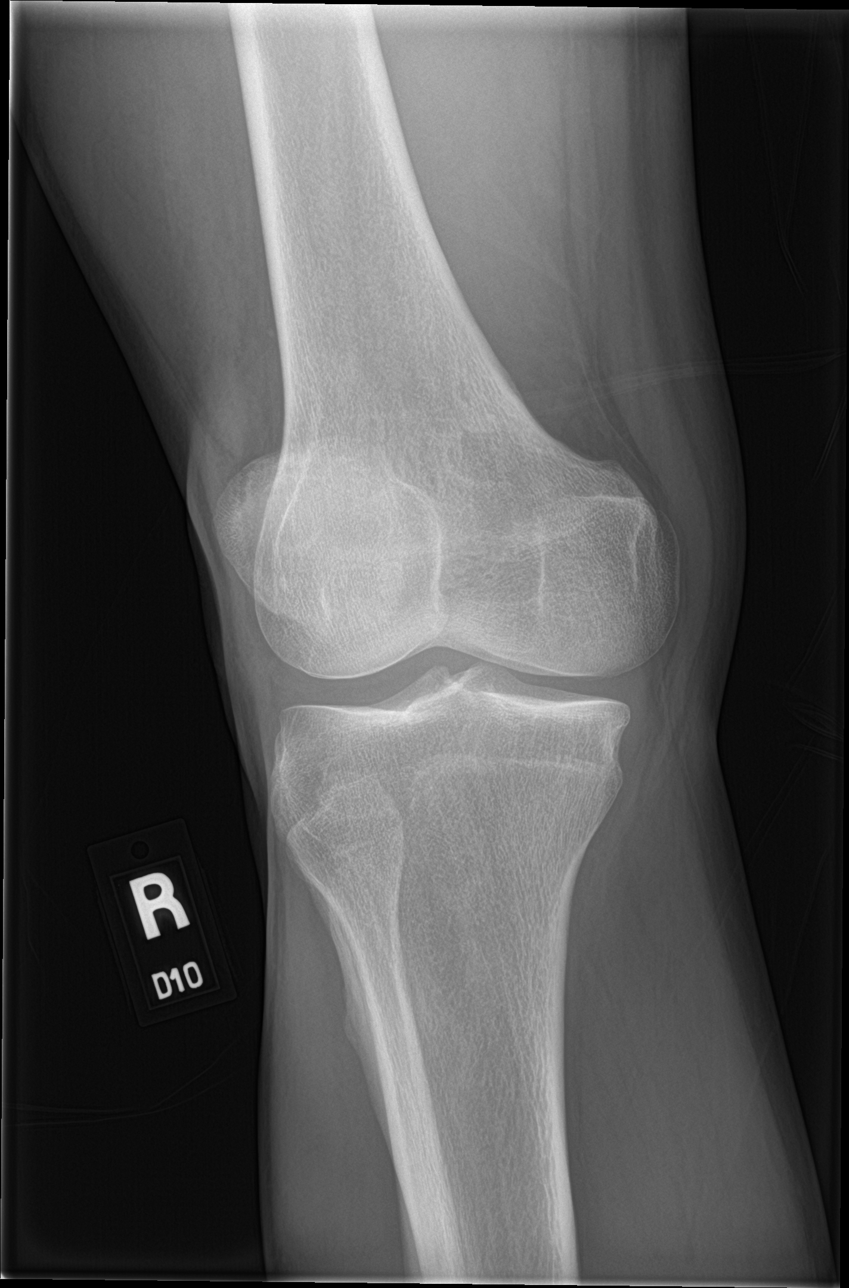

[4 of 4 positions shown; findings below may reference images not displayed]

FINDINGS: No significant swelling or effusion. No soft tissue gas or foreign
body. No acute bony abnormality. Specifically, no fracture,
subluxation, or dislocation. Mild enthesopathic changes along the
anterosuperior pole patella at the distal quadriceps insertion.
Corticated fabella posteriorly. No acute or worrisome osseous
lesion.
IMPRESSION: 1. No acute osseous or soft tissue abnormality.
2. Mild enthesopathic changes along the anterosuperior pole patellar
insertion of the distal quadriceps tendon.

## 2021-01-18 IMAGING — CT CT ABD-PELV W/ CM
2 of 5 series · 15 of 46 positions shown, 17 images · IV contrast (omnipaque)
Comparison: Chest radiograph 09/18/2019

CLINICAL DATA: Pinned by tree limb and tree, right abdominal and
flank pain

EXAM:
CT ABDOMEN AND PELVIS WITH CONTRAST
TECHNIQUE: Multidetector CT imaging of the abdomen and pelvis was performed
using the standard protocol following bolus administration of
intravenous contrast.
CONTRAST:  100mL OMNIPAQUE IOHEXOL 300 MG/ML  SOLN

[Series 3: abdomen 5.0 · axial · 0.77mm/px · z∈[+802,+1152]mm · 12 of 84 slices shown, 14 images]
[im 7/84  soft-tissue]
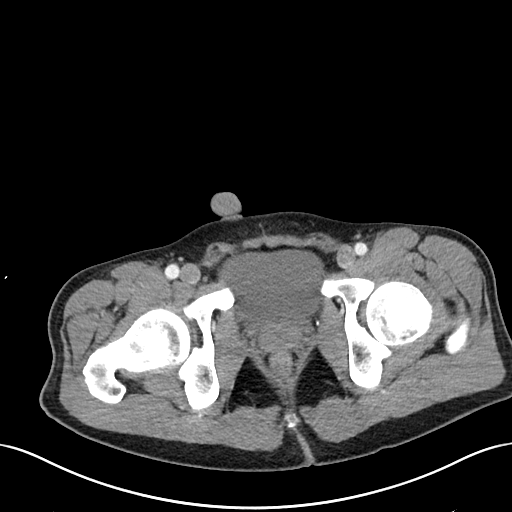
[im 7/84  bone]
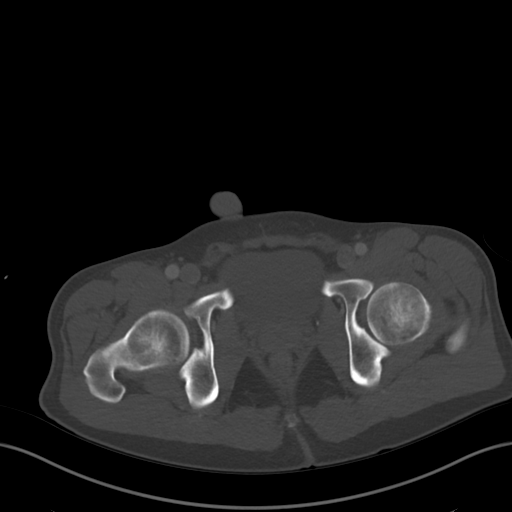
[im 13/84  soft-tissue]
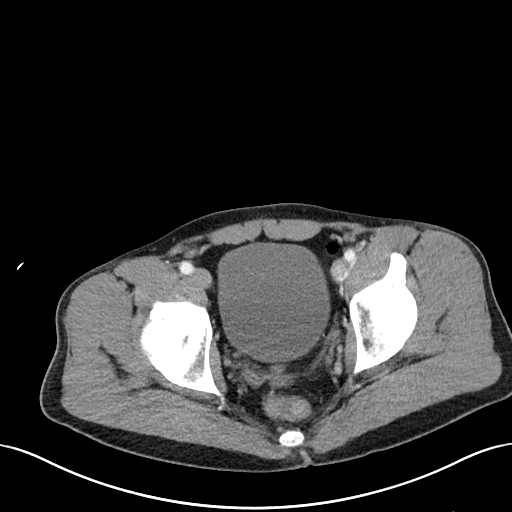
[im 20/84  soft-tissue]
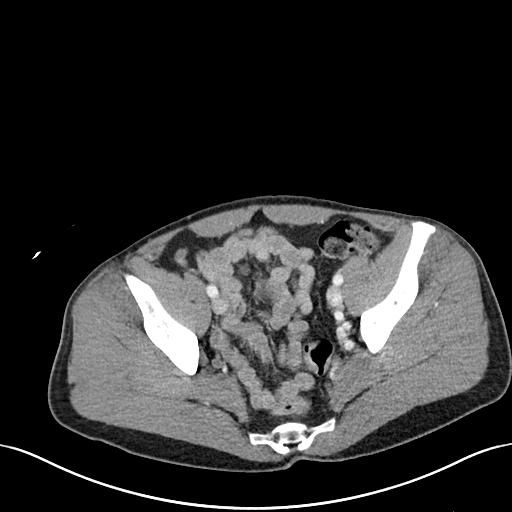
[im 26/84  soft-tissue]
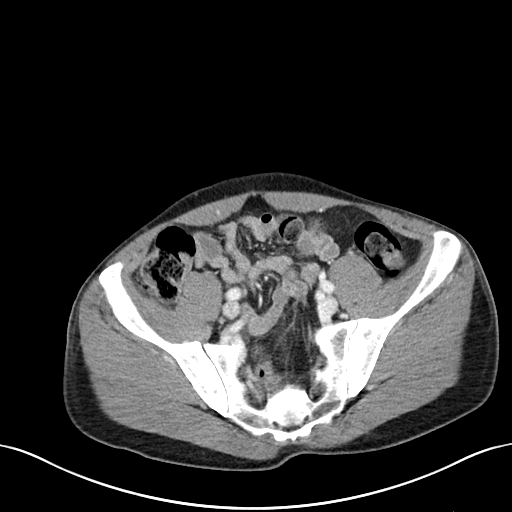
[im 32/84  soft-tissue]
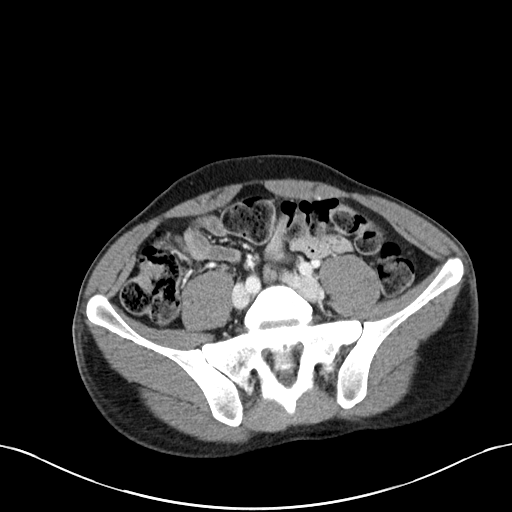
[im 39/84  soft-tissue]
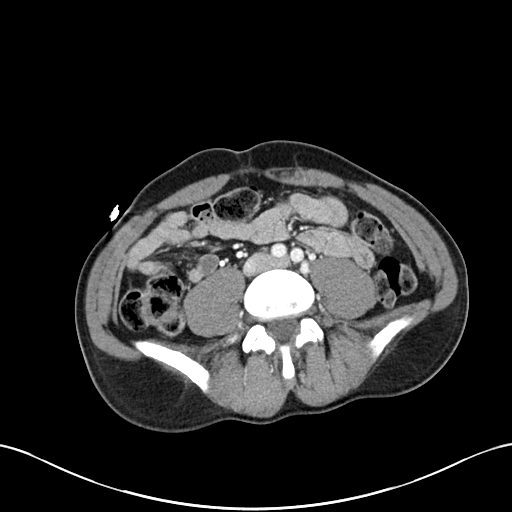
[im 45/84  soft-tissue]
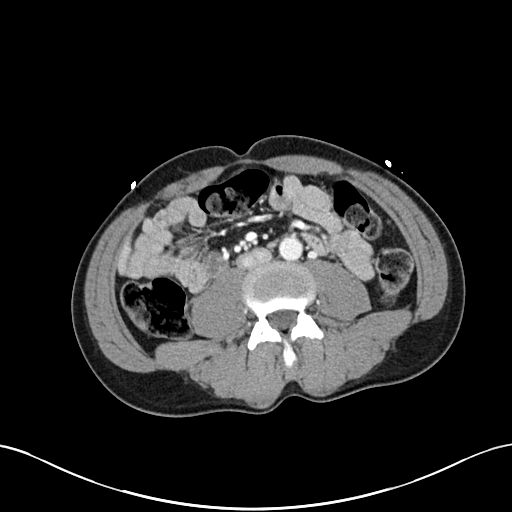
[im 52/84  soft-tissue]
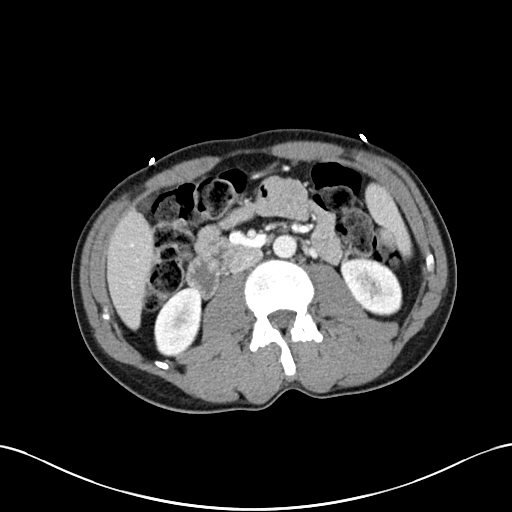
[im 58/84  soft-tissue]
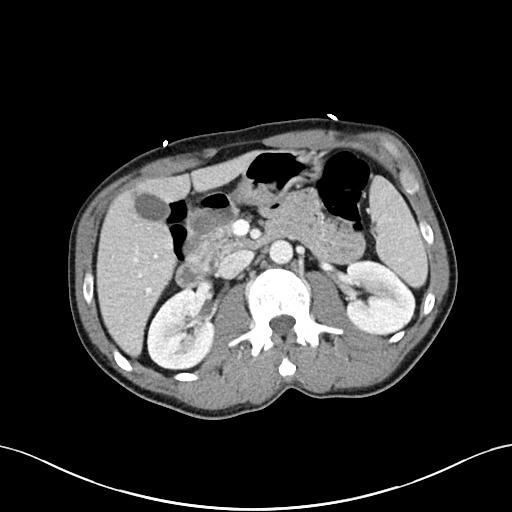
[im 58/84  bone]
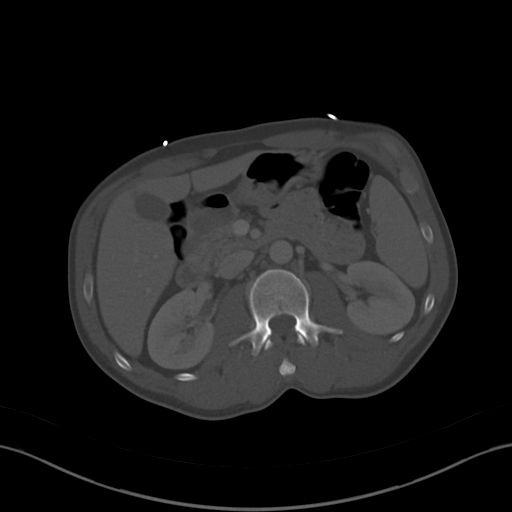
[im 64/84  soft-tissue]
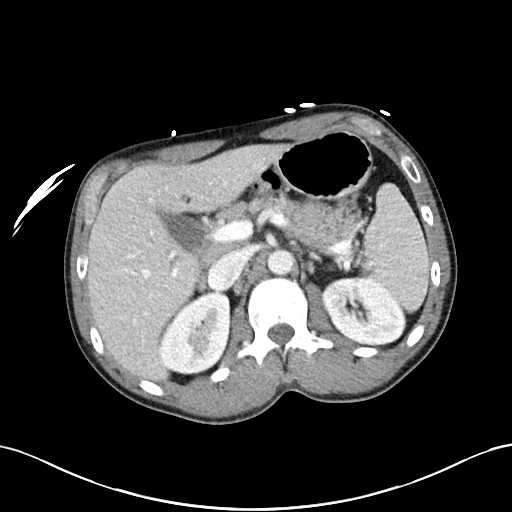
[im 71/84  soft-tissue]
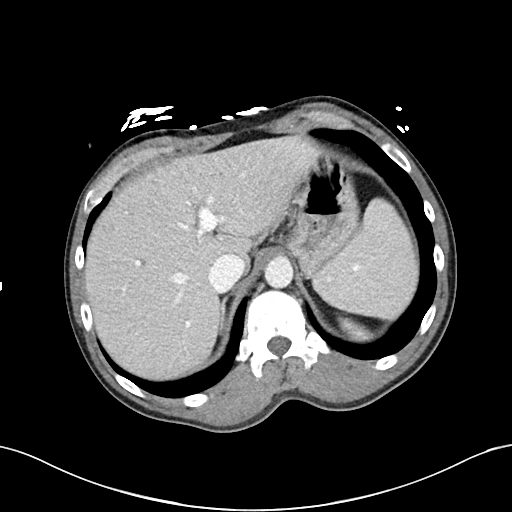
[im 77/84  soft-tissue]
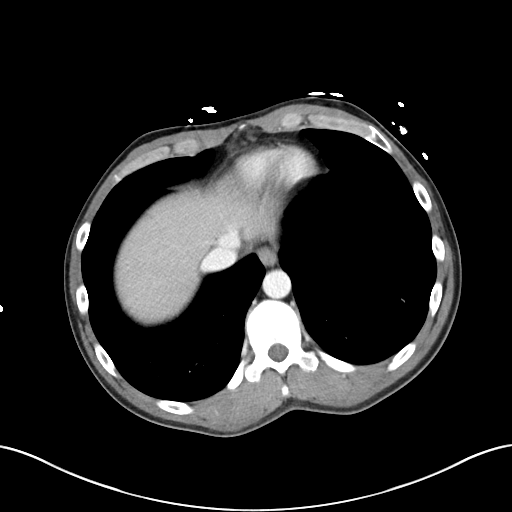

[Series 6: abdomen 3.0 mpr cor · coronal · 0.61mm/px · 3 of 97 slices shown]
[im 33/97  soft-tissue]
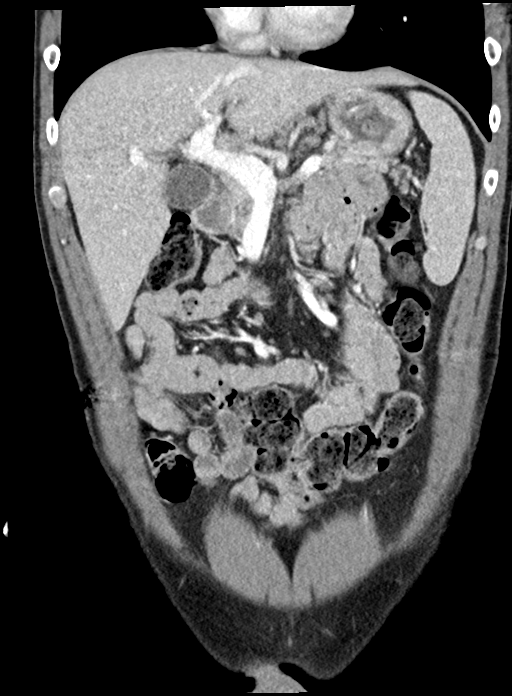
[im 43/97  soft-tissue]
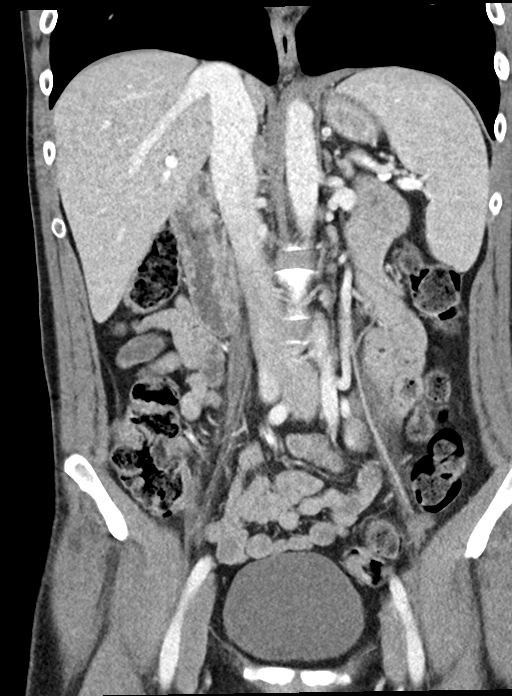
[im 54/97  soft-tissue]
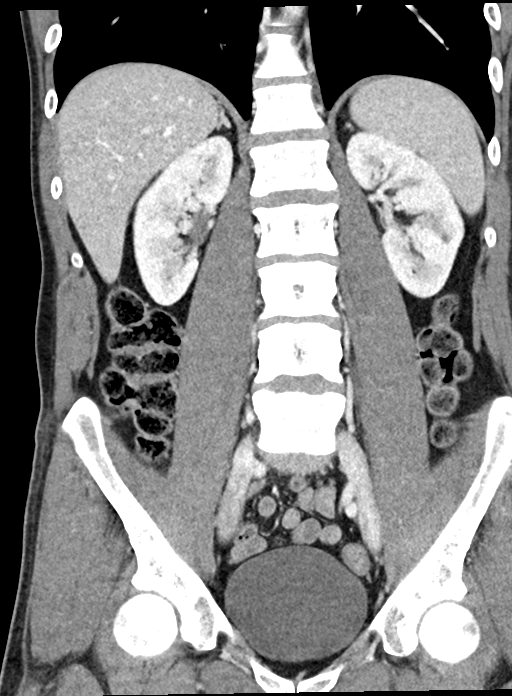

[15 of 46 positions shown; findings below may reference images not displayed]

FINDINGS: Lower chest: Lung bases are clear. Normal heart size. No pericardial
effusion.

Hepatobiliary: No hepatic injury or perihepatic hematoma. No
worrisome focal liver lesions. Smooth liver surface contour. Normal
hepatic attenuation. Normal gallbladder and biliary tree without
visible calcified gallstone.

Pancreas: Slight diminutive appearance of the pancreas without
contusive change, ductal disruption, focal peripancreatic
inflammation or ductal dilatation.

Spleen: No splenic injury or perisplenic hematoma. Normal splenic
size. No concerning lesions.

Adrenals/Urinary Tract: No adrenal hemorrhage or suspicious adrenal
lesions. No direct renal injury or perinephric hemorrhage. Kidneys
enhance and excrete symmetrically without extravasation of contrast
on excretory delayed phase imaging. Few subcentimeter hypertension
foci in both kidneys too small to fully characterize on CT imaging
but statistically likely benign. No concerning renal mass. No
urolithiasis or hydronephrosis. No evidence of traumatic bladder
injury.

Stomach/Bowel: Distal esophagus, stomach and duodenal sweep are
unremarkable. No small bowel wall thickening or dilatation. No
evidence of obstruction. A normal appendix is visualized. No colonic
dilatation or wall thickening. No evidence of mesenteric hematoma or
contusion.

Vascular/Lymphatic: No acute vascular luminal abnormality or direct
vascular injury of the abdomen or pelvis. No aneurysm or ectasia. No
suspicious or enlarged lymph nodes in the included lymphatic chains.

Reproductive: The prostate and seminal vesicles are unremarkable.

Other: No abdominal wall hematoma or contusion. No retroperitoneal
hemorrhage. No abdominopelvic free air or fluid. No bowel containing
hernias.

Musculoskeletal: No acute injury of the included ribs, thoracolumbar
spine, bony pelvis or proximal femora. Musculature appears normal
and symmetric without traumatic abdominal wall dehiscence.
IMPRESSION: No evidence of acute traumatic injury within the abdomen or pelvis.
